# Patient Record
Sex: Female | Born: 1985
Health system: Southern US, Community
[De-identification: ages and names within clinical notes are randomized; demographics above are authoritative.]

## PROBLEM LIST (undated history)

## (undated) HISTORY — PX: ACHILLES TENDON SURGERY: SHX542

---

## 2020-08-07 ENCOUNTER — Encounter (INDEPENDENT_AMBULATORY_CARE_PROVIDER_SITE_OTHER): Payer: Self-pay | Admitting: Primary Care

## 2020-08-07 ENCOUNTER — Ambulatory Visit (INDEPENDENT_AMBULATORY_CARE_PROVIDER_SITE_OTHER): Payer: BC Managed Care – PPO | Admitting: Primary Care

## 2020-08-07 ENCOUNTER — Other Ambulatory Visit: Payer: Self-pay

## 2020-08-07 ENCOUNTER — Other Ambulatory Visit: Payer: Self-pay | Admitting: Primary Care

## 2020-08-07 VITALS — BP 117/80 | HR 71 | Temp 97.5°F | Ht 67.0 in | Wt 222.2 lb

## 2020-08-07 DIAGNOSIS — Z23 Encounter for immunization: Secondary | ICD-10-CM

## 2020-08-07 DIAGNOSIS — E6609 Other obesity due to excess calories: Secondary | ICD-10-CM

## 2020-08-07 DIAGNOSIS — Z6834 Body mass index (BMI) 34.0-34.9, adult: Secondary | ICD-10-CM

## 2020-08-07 DIAGNOSIS — J302 Other seasonal allergic rhinitis: Secondary | ICD-10-CM

## 2020-08-07 DIAGNOSIS — Z13220 Encounter for screening for lipoid disorders: Secondary | ICD-10-CM | POA: Diagnosis not present

## 2020-08-07 DIAGNOSIS — Z7689 Persons encountering health services in other specified circumstances: Secondary | ICD-10-CM

## 2020-08-07 DIAGNOSIS — Z114 Encounter for screening for human immunodeficiency virus [HIV]: Secondary | ICD-10-CM

## 2020-08-07 DIAGNOSIS — R718 Other abnormality of red blood cells: Secondary | ICD-10-CM

## 2020-08-07 DIAGNOSIS — N920 Excessive and frequent menstruation with regular cycle: Secondary | ICD-10-CM

## 2020-08-07 DIAGNOSIS — Z131 Encounter for screening for diabetes mellitus: Secondary | ICD-10-CM | POA: Diagnosis not present

## 2020-08-07 DIAGNOSIS — R5383 Other fatigue: Secondary | ICD-10-CM

## 2020-08-07 LAB — POCT GLYCOSYLATED HEMOGLOBIN (HGB A1C): Hemoglobin A1C: 5.5 % (ref 4.0–5.6)

## 2020-08-07 MED ORDER — FLUTICASONE PROPIONATE 50 MCG/ACT NA SUSP: 2.0000 | Freq: Every day | NASAL | 6 refills | Status: DC

## 2020-08-07 MED FILL — FLUTICASONE PROP 50 MCG SPR: 50 | 16 days supply | Qty: 16 | Fill #0

## 2020-08-07 NOTE — Progress Notes (Signed)
New Patient Office Visit  Subjective:  Patient ID: Janice Reyes, female    DOB: [birth year]  Age: 35 y.o. MRN: [id number]  CC:  Chief Complaint  Patient presents with  . New Patient (Initial Visit)    HPI Ms. Janice Reyes  Is a 35 year old obese female who presents for establishment of care. She voiced concerns that the plasma center told her she was not eligible to donate due to atypical antibody test.  History reviewed. No pertinent past medical history.  The histories are not reviewed yet. Please review them in the "History" navigator section and refresh this Gun[place].  History reviewed. No pertinent family history.  Social History   Socioeconomic History  . Marital status: Significant Other    Spouse name: Not on file  . Number of children: Not on file  . Years of education: Not on file  . Highest education level: Not on file  Occupational History  . Not on file  Tobacco Use  . Smoking status: Never Smoker  . Smokeless tobacco: Never Used  Substance and Sexual Activity  . Alcohol use: Not on file  . Drug use: Not on file  . Sexual activity: Not on file  Other Topics Concern  . Not on file  Social History Narrative  . Not on file   Social Determinants of Health   Financial Resource Strain: Not on file  Food Insecurity: Not on file  Transportation Needs: Not on file  Physical Activity: Not on file  Stress: Not on file  Social Connections: Not on file  Intimate Partner Violence: Not on file    ROS Review of Systems  Constitutional: Positive for fatigue.  HENT: Positive for postnasal drip and sneezing.   Eyes: Positive for itching.  Musculoskeletal: Positive for back pain.       Muscle strain back 4-5 year ago lifting weight that were too heavy   Neurological: Positive for weakness.       Tired no energy  All other systems reviewed and are negative.   Objective:   Today's Vitals: BP 117/80 (BP Location: Right Arm, Patient Position: Sitting,  Cuff Size: Large)   Pulse 71   Temp (!) 97.5 F (36.4 C) (Temporal)   Ht 5' 7"  (1.702 m)   Wt 222 lb 3.2 oz (100.8 kg)   LMP 07/27/2020 (Exact Date)   SpO2 99%   BMI 34.80 kg/m   Physical Exam Vitals reviewed.  Constitutional:      Appearance: She is obese.  HENT:     Head: Normocephalic.     Right Ear: Tympanic membrane normal. There is impacted cerumen.     Left Ear: Tympanic membrane normal. There is impacted cerumen.     Nose: Nose normal.  Eyes:     Extraocular Movements: Extraocular movements intact.     Pupils: Pupils are equal, round, and reactive to light.  Cardiovascular:     Rate and Rhythm: Normal rate and regular rhythm.  Pulmonary:     Effort: Pulmonary effort is normal.     Breath sounds: Normal breath sounds.  Abdominal:     General: Bowel sounds are normal. There is distension.     Palpations: Abdomen is soft.  Musculoskeletal:        General: Normal range of motion.     Cervical back: Normal range of motion and neck supple.  Skin:    General: Skin is warm and dry.  Neurological:     Mental Status: She is alert  and oriented to person, place, and time.  Psychiatric:        Mood and Affect: Mood normal.        Behavior: Behavior normal.        Thought Content: Thought content normal.        Judgment: Judgment normal.     Assessment & Plan:  [facility] was seen today for new patient (initial visit).  Diagnoses and all orders for this visit:  Encounter to establish care New provider previously [place]   Screening for diabetes mellitus -     HgB A1c 5.4 peer ADA guidelines not a diabetic advised weight loss and watching carbs . (family hx of DM mother and maternal grandmother )  Need for Tdap vaccination -     Tdap vaccine greater than or equal to 7yo IM  Class 1 obesity due to excess calories without serious comorbidity with body mass index (BMI) of 34.0 to 34.9 in adult Obesity is 30-39 indicating an excess in caloric intake or underlining  conditions. This may lead to other co-morbidities. Lifestyle modifications of diet and exercise may reduce obesity. Discussed risk of HTN, DM and respiratory problems  -     TSH + free T4 -     Lipid Panel  Encounter for screening for HIV ATYA test positive   Abnormal red blood cells Hx of anemia ATYA test positive  -     CBC with Differential -     CMP14+EGFR  Fatigue, unspecified type Sleeping all the time denies depression  TSH/T4  Lipid screening Obesity increase risk for CVD -     Lipid Panel    Follow-up: Return for pap is due after 03/25/21.   Kerin Perna, NP

## 2020-08-08 ENCOUNTER — Other Ambulatory Visit (INDEPENDENT_AMBULATORY_CARE_PROVIDER_SITE_OTHER): Payer: Self-pay | Admitting: Primary Care

## 2020-08-08 DIAGNOSIS — E782 Mixed hyperlipidemia: Secondary | ICD-10-CM

## 2020-08-08 LAB — TSH+FREE T4
Free T4: 1.29 ng/dL (ref 0.82–1.77)
TSH: 1.87 u[IU]/mL (ref 0.450–4.500)

## 2020-08-08 LAB — CBC WITH DIFFERENTIAL/PLATELET
Basophils Absolute: 0.1 10*3/uL (ref 0.0–0.2)
Basos: 1 %
EOS (ABSOLUTE): 0.2 10*3/uL (ref 0.0–0.4)
Eos: 4 %
Hematocrit: 35.7 % (ref 34.0–46.6)
Hemoglobin: 11.8 g/dL (ref 11.1–15.9)
Immature Grans (Abs): 0 10*3/uL (ref 0.0–0.1)
Immature Granulocytes: 0 %
Lymphocytes Absolute: 2.3 10*3/uL (ref 0.7–3.1)
Lymphs: 48 %
MCH: 28.2 pg (ref 26.6–33.0)
MCHC: 33.1 g/dL (ref 31.5–35.7)
MCV: 85 fL (ref 79–97)
Monocytes Absolute: 0.4 10*3/uL (ref 0.1–0.9)
Monocytes: 8 %
Neutrophils Absolute: 1.9 10*3/uL (ref 1.4–7.0)
Neutrophils: 39 %
Platelets: 299 10*3/uL (ref 150–450)
RBC: 4.18 x10E6/uL (ref 3.77–5.28)
RDW: 12.5 % (ref 11.7–15.4)
WBC: 4.8 10*3/uL (ref 3.4–10.8)

## 2020-08-08 LAB — CMP14+EGFR
ALT: 15 IU/L (ref 0–32)
AST: 19 IU/L (ref 0–40)
Albumin/Globulin Ratio: 1.6 (ref 1.2–2.2)
Albumin: 4.5 g/dL (ref 3.8–4.8)
Alkaline Phosphatase: 31 IU/L — ABNORMAL LOW (ref 44–121)
BUN/Creatinine Ratio: 15 (ref 9–23)
BUN: 13 mg/dL (ref 6–20)
Bilirubin Total: 0.4 mg/dL (ref 0.0–1.2)
CO2: 22 mmol/L (ref 20–29)
Calcium: 9.7 mg/dL (ref 8.7–10.2)
Chloride: 104 mmol/L (ref 96–106)
Creatinine, Ser: 0.87 mg/dL (ref 0.57–1.00)
Globulin, Total: 2.8 g/dL (ref 1.5–4.5)
Glucose: 86 mg/dL (ref 65–99)
Potassium: 4.5 mmol/L (ref 3.5–5.2)
Sodium: 139 mmol/L (ref 134–144)
Total Protein: 7.3 g/dL (ref 6.0–8.5)
eGFR: 90 mL/min/{1.73_m2} (ref >59)

## 2020-08-08 LAB — LIPID PANEL
Chol/HDL Ratio: 4.8 ratio — ABNORMAL HIGH (ref 0.0–4.4)
Cholesterol, Total: 248 mg/dL — ABNORMAL HIGH (ref 100–199)
HDL: 52 mg/dL (ref >39)
LDL Chol Calc (NIH): 174 mg/dL — ABNORMAL HIGH (ref 0–99)
Triglycerides: 120 mg/dL (ref 0–149)
VLDL Cholesterol Cal: 22 mg/dL (ref 5–40)

## 2020-08-08 MED ORDER — ATORVASTATIN CALCIUM 40 MG PO TABS: 40.0000 mg | ORAL_TABLET | Freq: Every day | ORAL | 3 refills | Status: DC

## 2021-04-17 ENCOUNTER — Encounter (HOSPITAL_COMMUNITY): Payer: Self-pay

## 2021-04-17 ENCOUNTER — Ambulatory Visit (HOSPITAL_COMMUNITY)
Admission: EM | Admit: 2021-04-17 | Discharge: 2021-04-17 | Disposition: A | Discharge status: Home / Self Care | Payer: BC Managed Care – PPO | Attending: Family Medicine | Admitting: Family Medicine

## 2021-04-17 ENCOUNTER — Ambulatory Visit (INDEPENDENT_AMBULATORY_CARE_PROVIDER_SITE_OTHER): Payer: BC Managed Care – PPO

## 2021-04-17 ENCOUNTER — Other Ambulatory Visit: Payer: Self-pay

## 2021-04-17 DIAGNOSIS — M25572 Pain in left ankle and joints of left foot: Secondary | ICD-10-CM

## 2021-04-17 DIAGNOSIS — M25571 Pain in right ankle and joints of right foot: Secondary | ICD-10-CM

## 2021-04-17 DIAGNOSIS — S86001A Unspecified injury of right Achilles tendon, initial encounter: Secondary | ICD-10-CM

## 2021-04-17 DIAGNOSIS — S99912A Unspecified injury of left ankle, initial encounter: Secondary | ICD-10-CM | POA: Diagnosis not present

## 2021-04-17 DIAGNOSIS — M79604 Pain in right leg: Secondary | ICD-10-CM | POA: Diagnosis not present

## 2021-04-17 MED ORDER — NAPROXEN 500 MG PO TABS: 500.0000 mg | ORAL_TABLET | Freq: Two times a day (BID) | ORAL | 0 refills | Status: DC

## 2021-04-17 NOTE — ED Provider Notes (Signed)
[facility]   [id number] 04/17/21 Arrival Time: 0905  ASSESSMENT & PLAN:  1. Injury of right Achilles tendon, initial encounter   2. Acute right ankle pain    Question partial Achilles tendon rupture; discussed.  I have personally viewed the imaging studies ordered this visit. No ankle fx appreciated.  Begin: Meds ordered this encounter  Medications   naproxen (NAPROSYN) 500 MG tablet    Sig: Take 1 tablet (500 mg total) by mouth 2 (two) times daily with a meal.    Dispense:  20 tablet    Refill:  0    Orders Placed This Encounter  Procedures   DG Ankle Complete Left   Apply CAM boot   Crutches  To remain non-wt bearing until specialist f/u.  Recommend:  Follow-up Information     Schedule an appointment as soon as possible for a visit  with Ortho, [facility].   Specialty: Specialist Contact information: [address] [place] [address] [phone]                 Reviewed expectations re: course of current medical issues. Questions answered. Outlined signs and symptoms indicating need for more acute intervention. Patient verbalized understanding. After Visit Summary given.  SUBJECTIVE: History from: patient. Janice Reyes is a 35 y.o. female who reports R post ankle pain; x 1 day; playing basketball; "heard a pop and felt pain". Able to bear wt since but painful. No swelling/bruising. No tx PTA. No extremity sensation changes or weakness.   OBJECTIVE:  Vitals:   04/17/21 1054  BP: (!) 143/80  Pulse: 82  Resp: 18  Temp: 98.3 F (36.8 C)  TempSrc: Oral  SpO2: 100%    General appearance: alert; no distress HEENT: [place]; AT Neck: supple with FROM Resp: unlabored respirations Extremities: RLE: warm with well perfused appearance; fairly well localized moderate tenderness over right post ankle over Achilles tendon; tendon does appear intact but soft compared to left; without gross deformities; swelling: none; bruising: none;  ankle ROM: normal CV: brisk extremity capillary refill of RLE; 2+ radial pulse of RLE. Skin: warm and dry; no visible rashes Neurologic: normal sensation and strength of RLE Psychological: alert and cooperative; normal mood and affect  Imaging: DG Ankle Complete Left  Result Date: 04/17/2021 CLINICAL DATA:  Trauma, pain EXAM: LEFT ANKLE COMPLETE - 3+ VIEW COMPARISON:  None. FINDINGS: There is no evidence of fracture, dislocation, or joint effusion. There is no evidence of arthropathy or other focal bone abnormality. Soft tissues are unremarkable. IMPRESSION: Negative. Electronically Signed   By: Ernie Avena M.D.   On: 04/17/2021 11:13      Allergies  Allergen Reactions   Amoxicillin Itching and Swelling    History reviewed. No pertinent past medical history. Social History   Socioeconomic History   Marital status: Significant Other    Spouse name: Not on file   Number of children: Not on file   Years of education: Not on file   Highest education level: Not on file  Occupational History   Not on file  Tobacco Use   Smoking status: Never   Smokeless tobacco: Never  Substance and Sexual Activity   Alcohol use: Not on file   Drug use: Not on file   Sexual activity: Not on file  Other Topics Concern   Not on file  Social History Narrative   Not on file   Social Determinants of Health   Financial Resource Strain: Not on file  Food Insecurity:  Not on file  Transportation Needs: Not on file  Physical Activity: Not on file  Stress: Not on file  Social Connections: Not on file   History reviewed. No pertinent family history. History reviewed. No pertinent surgical history.     Mardella Layman, MD 04/17/21 1248

## 2021-04-17 NOTE — ED Triage Notes (Signed)
Pt presents to the office for left ankle pain after playing basketball x 1 day.

## 2021-04-19 DIAGNOSIS — M79604 Pain in right leg: Secondary | ICD-10-CM | POA: Diagnosis not present

## 2021-05-01 DIAGNOSIS — S86011A Strain of right Achilles tendon, initial encounter: Secondary | ICD-10-CM | POA: Insufficient documentation

## 2021-05-01 DIAGNOSIS — S86091A Other specified injury of right Achilles tendon, initial encounter: Secondary | ICD-10-CM | POA: Diagnosis not present

## 2021-05-02 DIAGNOSIS — S86011A Strain of right Achilles tendon, initial encounter: Secondary | ICD-10-CM | POA: Diagnosis not present

## 2021-05-02 DIAGNOSIS — Y9367 Activity, basketball: Secondary | ICD-10-CM | POA: Diagnosis not present

## 2021-05-02 DIAGNOSIS — X58XXXA Exposure to other specified factors, initial encounter: Secondary | ICD-10-CM | POA: Diagnosis not present

## 2021-05-02 DIAGNOSIS — S86091A Other specified injury of right Achilles tendon, initial encounter: Secondary | ICD-10-CM | POA: Diagnosis not present

## 2021-05-02 DIAGNOSIS — G8918 Other acute postprocedural pain: Secondary | ICD-10-CM | POA: Diagnosis not present

## 2021-05-06 DIAGNOSIS — S86091D Other specified injury of right Achilles tendon, subsequent encounter: Secondary | ICD-10-CM | POA: Diagnosis not present

## 2021-05-22 DIAGNOSIS — S86091D Other specified injury of right Achilles tendon, subsequent encounter: Secondary | ICD-10-CM | POA: Diagnosis not present

## 2021-07-09 DIAGNOSIS — M25571 Pain in right ankle and joints of right foot: Secondary | ICD-10-CM | POA: Insufficient documentation

## 2021-07-09 DIAGNOSIS — M25671 Stiffness of right ankle, not elsewhere classified: Secondary | ICD-10-CM | POA: Diagnosis not present

## 2021-07-18 DIAGNOSIS — M25571 Pain in right ankle and joints of right foot: Secondary | ICD-10-CM | POA: Diagnosis not present

## 2021-07-18 DIAGNOSIS — M25671 Stiffness of right ankle, not elsewhere classified: Secondary | ICD-10-CM | POA: Diagnosis not present

## 2021-07-21 DIAGNOSIS — M25571 Pain in right ankle and joints of right foot: Secondary | ICD-10-CM | POA: Diagnosis not present

## 2021-07-21 DIAGNOSIS — M25671 Stiffness of right ankle, not elsewhere classified: Secondary | ICD-10-CM | POA: Diagnosis not present

## 2021-07-24 DIAGNOSIS — M25571 Pain in right ankle and joints of right foot: Secondary | ICD-10-CM | POA: Diagnosis not present

## 2021-07-24 DIAGNOSIS — M25671 Stiffness of right ankle, not elsewhere classified: Secondary | ICD-10-CM | POA: Diagnosis not present

## 2021-07-28 DIAGNOSIS — M25671 Stiffness of right ankle, not elsewhere classified: Secondary | ICD-10-CM | POA: Diagnosis not present

## 2021-07-28 DIAGNOSIS — M25571 Pain in right ankle and joints of right foot: Secondary | ICD-10-CM | POA: Diagnosis not present

## 2021-08-08 DIAGNOSIS — M25671 Stiffness of right ankle, not elsewhere classified: Secondary | ICD-10-CM | POA: Diagnosis not present

## 2021-08-08 DIAGNOSIS — M25571 Pain in right ankle and joints of right foot: Secondary | ICD-10-CM | POA: Diagnosis not present

## 2021-08-12 DIAGNOSIS — M25671 Stiffness of right ankle, not elsewhere classified: Secondary | ICD-10-CM | POA: Diagnosis not present

## 2021-08-12 DIAGNOSIS — M25571 Pain in right ankle and joints of right foot: Secondary | ICD-10-CM | POA: Diagnosis not present

## 2021-08-16 ENCOUNTER — Other Ambulatory Visit (INDEPENDENT_AMBULATORY_CARE_PROVIDER_SITE_OTHER): Payer: Self-pay | Admitting: Primary Care

## 2021-08-16 DIAGNOSIS — E782 Mixed hyperlipidemia: Secondary | ICD-10-CM

## 2021-08-18 DIAGNOSIS — M25671 Stiffness of right ankle, not elsewhere classified: Secondary | ICD-10-CM | POA: Diagnosis not present

## 2021-08-18 DIAGNOSIS — M25571 Pain in right ankle and joints of right foot: Secondary | ICD-10-CM | POA: Diagnosis not present

## 2021-08-21 DIAGNOSIS — M25571 Pain in right ankle and joints of right foot: Secondary | ICD-10-CM | POA: Diagnosis not present

## 2021-08-21 DIAGNOSIS — M25671 Stiffness of right ankle, not elsewhere classified: Secondary | ICD-10-CM | POA: Diagnosis not present

## 2021-08-22 DIAGNOSIS — S86091A Other specified injury of right Achilles tendon, initial encounter: Secondary | ICD-10-CM | POA: Diagnosis not present

## 2021-08-25 DIAGNOSIS — M25671 Stiffness of right ankle, not elsewhere classified: Secondary | ICD-10-CM | POA: Diagnosis not present

## 2021-08-25 DIAGNOSIS — M25571 Pain in right ankle and joints of right foot: Secondary | ICD-10-CM | POA: Diagnosis not present

## 2021-09-01 DIAGNOSIS — M25671 Stiffness of right ankle, not elsewhere classified: Secondary | ICD-10-CM | POA: Diagnosis not present

## 2021-09-01 DIAGNOSIS — M25571 Pain in right ankle and joints of right foot: Secondary | ICD-10-CM | POA: Diagnosis not present

## 2021-09-10 DIAGNOSIS — M25571 Pain in right ankle and joints of right foot: Secondary | ICD-10-CM | POA: Diagnosis not present

## 2021-09-10 DIAGNOSIS — M25671 Stiffness of right ankle, not elsewhere classified: Secondary | ICD-10-CM | POA: Diagnosis not present

## 2021-09-17 DIAGNOSIS — M25671 Stiffness of right ankle, not elsewhere classified: Secondary | ICD-10-CM | POA: Diagnosis not present

## 2021-09-17 DIAGNOSIS — M25571 Pain in right ankle and joints of right foot: Secondary | ICD-10-CM | POA: Diagnosis not present

## 2021-09-22 DIAGNOSIS — M25571 Pain in right ankle and joints of right foot: Secondary | ICD-10-CM | POA: Diagnosis not present

## 2021-09-22 DIAGNOSIS — M25671 Stiffness of right ankle, not elsewhere classified: Secondary | ICD-10-CM | POA: Diagnosis not present

## 2021-10-17 DIAGNOSIS — M25671 Stiffness of right ankle, not elsewhere classified: Secondary | ICD-10-CM | POA: Diagnosis not present

## 2021-10-17 DIAGNOSIS — M25571 Pain in right ankle and joints of right foot: Secondary | ICD-10-CM | POA: Diagnosis not present

## 2021-11-25 ENCOUNTER — Ambulatory Visit (HOSPITAL_COMMUNITY)
Admission: EM | Admit: 2021-11-25 | Discharge: 2021-11-25 | Disposition: A | Discharge status: Home / Self Care | Payer: BC Managed Care – PPO | Attending: Family Medicine | Admitting: Family Medicine

## 2021-11-25 ENCOUNTER — Encounter (HOSPITAL_COMMUNITY): Payer: Self-pay | Admitting: Emergency Medicine

## 2021-11-25 DIAGNOSIS — U071 COVID-19: Secondary | ICD-10-CM | POA: Insufficient documentation

## 2021-11-25 DIAGNOSIS — J029 Acute pharyngitis, unspecified: Secondary | ICD-10-CM | POA: Diagnosis not present

## 2021-11-25 DIAGNOSIS — R051 Acute cough: Secondary | ICD-10-CM | POA: Diagnosis not present

## 2021-11-25 DIAGNOSIS — J069 Acute upper respiratory infection, unspecified: Secondary | ICD-10-CM

## 2021-11-25 LAB — POCT RAPID STREP A, ED / UC: Streptococcus, Group A Screen (Direct): NEGATIVE

## 2021-11-25 LAB — SARS CORONAVIRUS 2 (TAT 6-24 HRS): SARS Coronavirus 2: POSITIVE — AB

## 2021-11-25 MED ORDER — BENZONATATE 100 MG PO CAPS: 100.0000 mg | ORAL_CAPSULE | Freq: Three times a day (TID) | ORAL | 0 refills | Status: DC | PRN

## 2021-11-25 NOTE — ED Provider Notes (Signed)
[facility]    CSN: [id number] Arrival date & time: 11/25/21  8341      History   Chief Complaint Chief Complaint  Patient presents with   Cough   Nasal Congestion    HPI Janice Reyes is a 36 y.o. female.    Cough  Here for headache, congestion, and some sore throat that began yesterday.  She felt feverish yesterday.  This morning she began coughing.  The throat still is bothering her.  No nausea, vomiting, or diarrhea.  No shortness of breath.  Today she does feel very tired.  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  History reviewed. No pertinent past medical history.  There are no problems to display for this patient.   Past Surgical History:  Procedure Laterality Date   ACHILLES TENDON SURGERY Right     OB History   No obstetric history on file.      Home Medications    Prior to Admission medications   Medication Sig Start Date End Date Taking? Authorizing Provider  benzonatate (TESSALON) 100 MG capsule Take 1 capsule (100 mg total) by mouth 3 (three) times daily as needed for cough. 11/25/21  Yes Zenia Resides, MD    Family History No family history on file.  Social History Social History   Tobacco Use   Smoking status: Never   Smokeless tobacco: Never     Allergies   Amoxicillin   Review of Systems Review of Systems  Respiratory:  Positive for cough.      Physical Exam Triage Vital Signs ED Triage Vitals  Enc Vitals Group     BP 11/25/21 0831 112/76     Pulse Rate 11/25/21 0831 74     Resp 11/25/21 0831 16     Temp 11/25/21 0831 98.9 F (37.2 C)     Temp src --      SpO2 11/25/21 0831 97 %     Weight --      Height --      Head Circumference --      Peak Flow --      Pain Score 11/25/21 0829 0     Pain Loc --      Pain Edu? --      Excl. in GC? --    No data found.  Updated Vital Signs BP 112/76 (BP Location: Left Arm)    Pulse 74   Temp 98.9 F (37.2 C)   Resp 16   LMP 10/30/2021   SpO2 97%   Visual Acuity Right Eye Distance:   Left Eye Distance:   Bilateral Distance:    Right Eye Near:   Left Eye Near:    Bilateral Near:     Physical Exam Vitals reviewed.  Constitutional:      General: She is not in acute distress.    Appearance: She is not toxic-appearing.  HENT:     Right Ear: Tympanic membrane and ear canal normal.     Left Ear: Tympanic membrane and ear canal normal.     Nose: Nose normal.     Mouth/Throat:     Mouth: Mucous membranes are moist.     Comments: There is mild erythema of the posterior oropharynx and some clear mucus draining. Eyes:     Extraocular Movements: Extraocular movements intact.     Conjunctiva/sclera: Conjunctivae normal.     Pupils: Pupils are equal, round, and reactive  to light.  Cardiovascular:     Rate and Rhythm: Normal rate and regular rhythm.     Heart sounds: No murmur heard. Pulmonary:     Effort: Pulmonary effort is normal. No respiratory distress.     Breath sounds: No stridor. No wheezing, rhonchi or rales.  Musculoskeletal:     Cervical back: Neck supple.  Lymphadenopathy:     Cervical: No cervical adenopathy.  Skin:    Capillary Refill: Capillary refill takes less than 2 seconds.     Coloration: Skin is not jaundiced or pale.  Neurological:     General: No focal deficit present.     Mental Status: She is alert and oriented to person, place, and time.  Psychiatric:        Behavior: Behavior normal.      UC Treatments / Results  Labs (all labs ordered are listed, but only abnormal results are displayed) Labs Reviewed  CULTURE, GROUP A STREP ([facility])  SARS CORONAVIRUS 2 (TAT 6-24 HRS)  POCT RAPID STREP A, ED / UC    EKG   Radiology No results found.  Procedures Procedures (including critical care time)  Medications Ordered in UC Medications - No data to display  Initial Impression / Assessment and Plan / UC Course  I  have reviewed the triage vital signs and the nursing notes.  Pertinent labs & imaging results that were available during my care of the patient were reviewed by me and considered in my medical decision making (see chart for details).     Rapid strep is negative, and we will culture and treat per protocol if positive  She is swabbed for COVID, and would benefit from Paxlovid prescription if positive.  Her creatinine was normal when last done in epic.  Symptomatic treatment Final Clinical Impressions(s) / UC Diagnoses   Final diagnoses:  Viral URI with cough  Sore throat     Discharge Instructions      Your strep test is negative.  Culture of the throat will be sent, and staff will notify you if that is in turn positive.   You have been swabbed for COVID, and the test will result in the next 24 hours. Our staff will call you if positive. If the test is positive, you should quarantine for 5 days.  Take benzonatate 100 mg, 1 tab every 8 hours as needed for cough.  You can also take Mucinex D or DayQuil/NyQuil as needed for the congestion symptoms.     ED Prescriptions     Medication Sig Dispense Auth. Provider   benzonatate (TESSALON) 100 MG capsule Take 1 capsule (100 mg total) by mouth 3 (three) times daily as needed for cough. 21 capsule Zenia Resides, MD      PDMP not reviewed this encounter.   Zenia Resides, MD 11/25/21 [phone]

## 2021-11-25 NOTE — ED Triage Notes (Signed)
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 bElieves was feverish yesterday.

## 2021-11-25 NOTE — Discharge Instructions (Addendum)
Your strep test is negative.  Culture of the throat will be sent, and staff will notify you if that is in turn positive.   You have been swabbed for COVID, and the test will result in the next 24 hours. Our staff will call you if positive. If the test is positive, you should quarantine for 5 days.  Take benzonatate 100 mg, 1 tab every 8 hours as needed for cough.  You can also take Mucinex D or DayQuil/NyQuil as needed for the congestion symptoms.

## 2021-11-26 ENCOUNTER — Telehealth (HOSPITAL_COMMUNITY): Payer: Self-pay | Admitting: Emergency Medicine

## 2021-11-26 MED ORDER — NIRMATRELVIR/RITONAVIR (PAXLOVID)TABLET: 3.0000 | ORAL_TABLET | Freq: Two times a day (BID) | ORAL | 0 refills | Status: AC

## 2021-11-26 MED ORDER — NIRMATRELVIR/RITONAVIR (PAXLOVID)TABLET: 3.0000 | ORAL_TABLET | Freq: Two times a day (BID) | ORAL | 0 refills | Status: DC

## 2021-11-27 LAB — CULTURE, GROUP A STREP (THRC)

## 2021-12-01 ENCOUNTER — Ambulatory Visit: Payer: BC Managed Care – PPO | Admitting: Family Medicine

## 2021-12-22 ENCOUNTER — Ambulatory Visit: Payer: BC Managed Care – PPO | Admitting: Family Medicine

## 2022-02-23 ENCOUNTER — Ambulatory Visit: Payer: 59 | Admitting: Family Medicine

## 2022-02-23 ENCOUNTER — Encounter: Payer: Self-pay | Admitting: Family Medicine

## 2022-02-23 VITALS — BP 128/80 | HR 75 | Temp 98.7°F | Resp 16 | Ht 67.0 in | Wt 211.2 lb

## 2022-02-23 DIAGNOSIS — Z130 Encounter for screening for diseases of the blood and blood-forming organs and certain disorders involving the immune mechanism: Secondary | ICD-10-CM

## 2022-02-23 DIAGNOSIS — Z0000 Encounter for general adult medical examination without abnormal findings: Secondary | ICD-10-CM | POA: Diagnosis not present

## 2022-02-23 DIAGNOSIS — Z13220 Encounter for screening for lipoid disorders: Secondary | ICD-10-CM

## 2022-02-23 DIAGNOSIS — Z13228 Encounter for screening for other metabolic disorders: Secondary | ICD-10-CM

## 2022-02-23 DIAGNOSIS — Z1159 Encounter for screening for other viral diseases: Secondary | ICD-10-CM

## 2022-02-23 DIAGNOSIS — Z1329 Encounter for screening for other suspected endocrine disorder: Secondary | ICD-10-CM

## 2022-02-24 LAB — CMP14+EGFR
ALT: 13 IU/L (ref 0–32)
AST: 18 IU/L (ref 0–40)
Albumin/Globulin Ratio: 1.6 (ref 1.2–2.2)
Albumin: 4.5 g/dL (ref 3.9–4.9)
Alkaline Phosphatase: 37 IU/L — ABNORMAL LOW (ref 44–121)
BUN/Creatinine Ratio: 10 (ref 9–23)
BUN: 8 mg/dL (ref 6–20)
Bilirubin Total: 0.3 mg/dL (ref 0.0–1.2)
CO2: 21 mmol/L (ref 20–29)
Calcium: 10.1 mg/dL (ref 8.7–10.2)
Chloride: 102 mmol/L (ref 96–106)
Creatinine, Ser: 0.79 mg/dL (ref 0.57–1.00)
Globulin, Total: 2.9 g/dL (ref 1.5–4.5)
Glucose: 81 mg/dL (ref 70–99)
Potassium: 4.2 mmol/L (ref 3.5–5.2)
Sodium: 141 mmol/L (ref 134–144)
Total Protein: 7.4 g/dL (ref 6.0–8.5)
eGFR: 101 mL/min/{1.73_m2} (ref >59)

## 2022-02-24 LAB — LIPID PANEL
Chol/HDL Ratio: 4.3 ratio (ref 0.0–4.4)
Cholesterol, Total: 219 mg/dL — ABNORMAL HIGH (ref 100–199)
HDL: 51 mg/dL (ref >39)
LDL Chol Calc (NIH): 147 mg/dL — ABNORMAL HIGH (ref 0–99)
Triglycerides: 119 mg/dL (ref 0–149)
VLDL Cholesterol Cal: 21 mg/dL (ref 5–40)

## 2022-02-24 LAB — CBC WITH DIFFERENTIAL/PLATELET
Basophils Absolute: 0.1 10*3/uL (ref 0.0–0.2)
Basos: 1 %
EOS (ABSOLUTE): 0.1 10*3/uL (ref 0.0–0.4)
Eos: 3 %
Hematocrit: 35.5 % (ref 34.0–46.6)
Hemoglobin: 11.5 g/dL (ref 11.1–15.9)
Immature Grans (Abs): 0 10*3/uL (ref 0.0–0.1)
Immature Granulocytes: 0 %
Lymphocytes Absolute: 2.4 10*3/uL (ref 0.7–3.1)
Lymphs: 45 %
MCH: 28 pg (ref 26.6–33.0)
MCHC: 32.4 g/dL (ref 31.5–35.7)
MCV: 86 fL (ref 79–97)
Monocytes Absolute: 0.4 10*3/uL (ref 0.1–0.9)
Monocytes: 7 %
Neutrophils Absolute: 2.3 10*3/uL (ref 1.4–7.0)
Neutrophils: 44 %
Platelets: 320 10*3/uL (ref 150–450)
RBC: 4.11 x10E6/uL (ref 3.77–5.28)
RDW: 12.5 % (ref 11.7–15.4)
WBC: 5.2 10*3/uL (ref 3.4–10.8)

## 2022-02-24 LAB — HEMOGLOBIN A1C
Est. average glucose Bld gHb Est-mCnc: 114 mg/dL
Hgb A1c MFr Bld: 5.6 % (ref 4.8–5.6)

## 2022-02-24 LAB — TSH: TSH: 2.3 u[IU]/mL (ref 0.450–4.500)

## 2022-02-24 LAB — HEPATITIS C ANTIBODY: Hep C Virus Ab: NONREACTIVE

## 2022-02-25 ENCOUNTER — Encounter: Payer: Self-pay | Admitting: Family Medicine

## 2022-02-25 NOTE — Progress Notes (Signed)
Established Patient Office Visit  Subjective    Patient ID: Janice Reyes, female    DOB: [birth year]  Age: 36 y.o. MRN: [id number]  CC: No chief complaint on file.   HPI Clary Boulais presents to establish care with a new provider as well as a routine annual exam. Patient denies acute complaints or concerns.    Outpatient Encounter Medications as of 02/23/2022  Medication Sig   atorvastatin (LIPITOR) 40 MG tablet Take 1 tablet by mouth daily.   [DISCONTINUED] benzonatate (TESSALON) 100 MG capsule Take 1 capsule (100 mg total) by mouth 3 (three) times daily as needed for cough.   No facility-administered encounter medications on file as of 02/23/2022.    No past medical history on file.  Past Surgical History:  Procedure Laterality Date   ACHILLES TENDON SURGERY Right     Family History  Problem Relation Age of Onset   Diabetes Mother    Hypertension Mother    Obesity Mother    Arthritis Father    Hearing loss Father    Arthritis Maternal Grandmother    Diabetes Maternal Grandmother    Alcohol abuse Sister    Heart disease Paternal Uncle    Stroke Paternal Uncle     Social History   Socioeconomic History   Marital status: Significant Other    Spouse name: Not on file   Number of children: Not on file   Years of education: Not on file   Highest education level: Not on file  Occupational History   Not on file  Tobacco Use   Smoking status: Never   Smokeless tobacco: Never  Substance and Sexual Activity   Alcohol use: Not Currently   Drug use: Yes    Types: Marijuana   Sexual activity: Yes    Birth control/protection: None  Other Topics Concern   Not on file  Social History Narrative   Not on file   Social Determinants of Health   Financial Resource Strain: Not on file  Food Insecurity: Not on file  Transportation Needs: Not on file  Physical Activity: Not on file  Stress: Not on file  Social Connections: Not on file   Intimate Partner Violence: Not on file    Review of Systems  All other systems reviewed and are negative.       Objective    BP 128/80   Pulse 75   Temp 98.7 F (37.1 C) (Oral)   Resp 16   Ht _0  (1.702 m)   Wt 211 lb 3.2 oz (95.8 kg)   SpO2 98%   BMI 33.08 kg/m   Physical Exam Vitals and nursing note reviewed.  Constitutional:      General: She is not in acute distress. HENT:     Head: Normocephalic and atraumatic.     Right Ear: Tympanic membrane, ear canal and external ear normal.     Left Ear: Tympanic membrane, ear canal and external ear normal.     Nose: Nose normal.     Mouth/Throat:     Mouth: Mucous membranes are moist.     Pharynx: Oropharynx is clear.  Eyes:     Conjunctiva/sclera: Conjunctivae normal.     Pupils: Pupils are equal, round, and reactive to light.  Neck:     Thyroid: No thyromegaly.  Cardiovascular:     Rate and Rhythm: Normal rate and regular rhythm.     Heart sounds: Normal heart sounds. No murmur heard. Pulmonary:  Effort: Pulmonary effort is normal. No respiratory distress.     Breath sounds: Normal breath sounds.  Abdominal:     General: There is no distension.     Palpations: Abdomen is soft. There is no mass.     Tenderness: There is no abdominal tenderness.  Musculoskeletal:        General: Normal range of motion.     Cervical back: Normal range of motion and neck supple.  Skin:    General: Skin is warm and dry.  Neurological:     General: No focal deficit present.     Mental Status: She is alert and oriented to person, place, and time.  Psychiatric:        Mood and Affect: Mood normal.        Behavior: Behavior normal.         Assessment & Plan:   1. Annual physical exam  - CMP14+EGFR  2. Screening for lipid disorders  - Lipid Panel  3. Screening for deficiency anemia  - CBC with Differential  4. Screening for endocrine/metabolic/immunity disorders  - Hemoglobin A1c - TSH  5. Need for  hepatitis C screening test  - Hepatitis C Antibody    No follow-ups on file.   Becky Sax, MD

## 2023-01-14 ENCOUNTER — Ambulatory Visit: Payer: Self-pay

## 2023-01-15 ENCOUNTER — Ambulatory Visit: Payer: Self-pay

## 2023-07-25 IMAGING — DX DG ANKLE COMPLETE 3+V*L*
3 series · 3 of 3 positions shown · non-contrast
Comparison: None.

CLINICAL DATA: Trauma, pain

EXAM:
LEFT ANKLE COMPLETE - 3+ VIEW

[ankle ap]
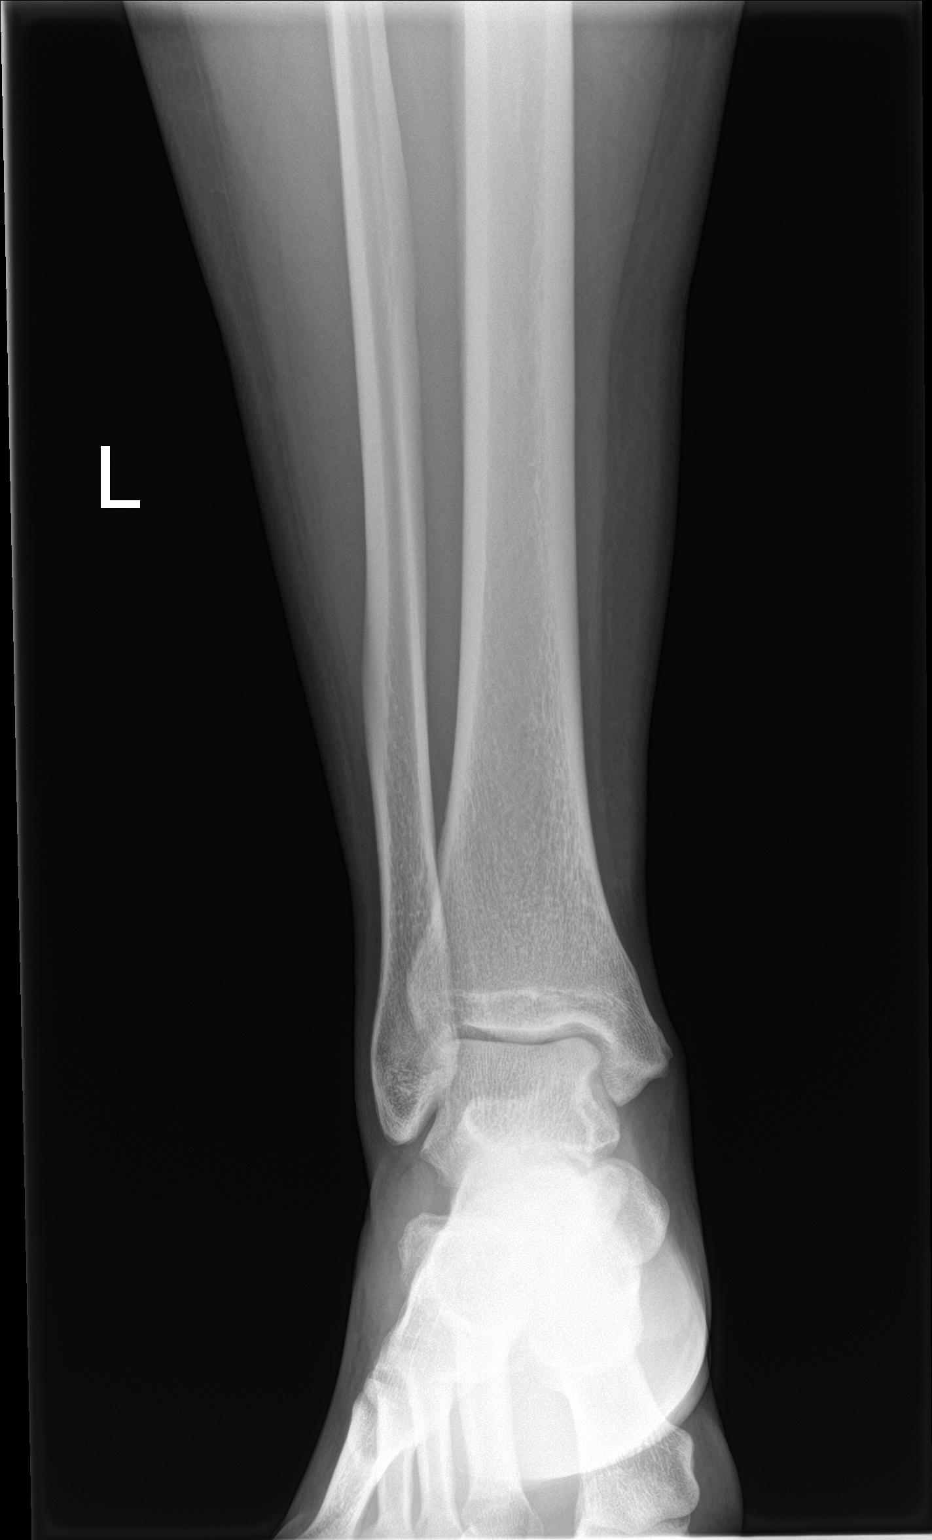

[ankle obl]
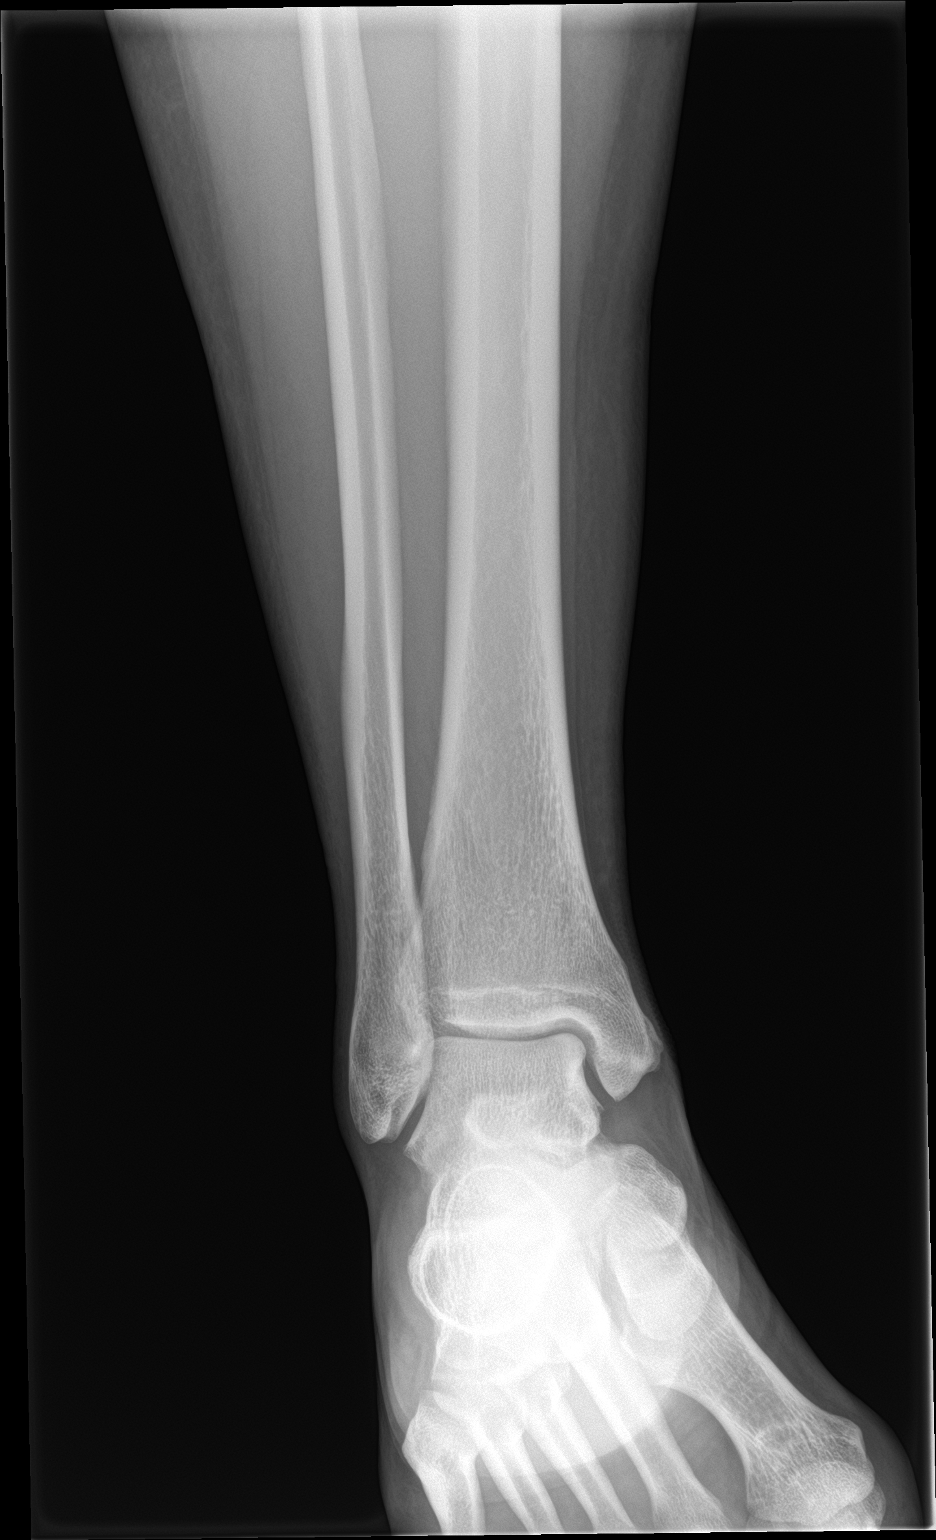

[ankle lat]
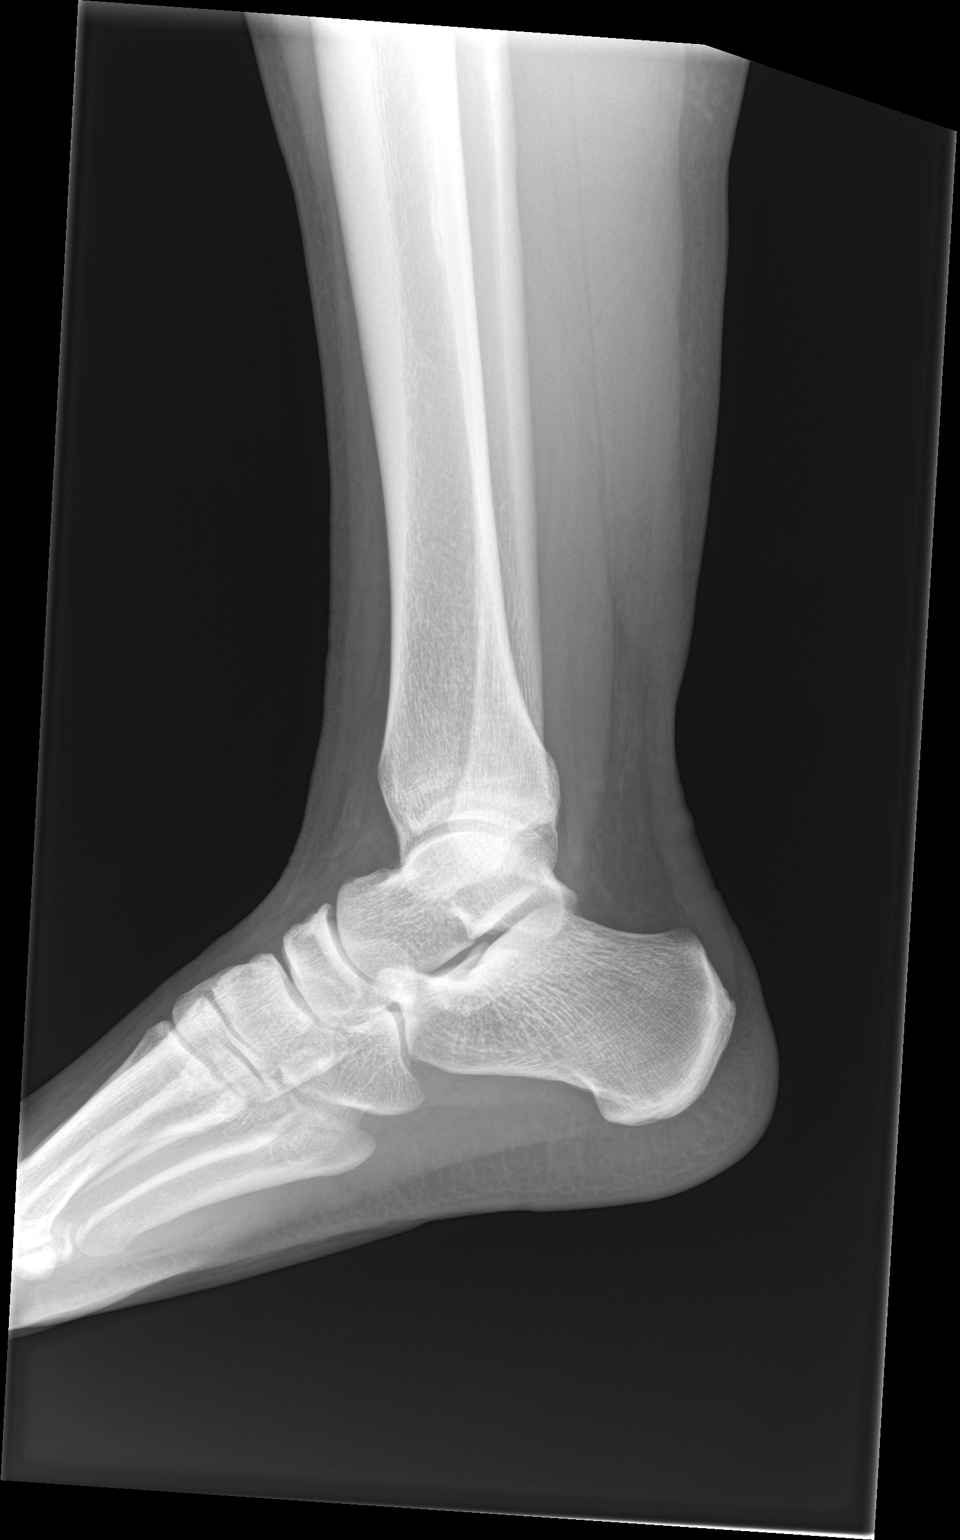

[3 of 3 positions shown; findings below may reference images not displayed]

FINDINGS: There is no evidence of fracture, dislocation, or joint effusion.
There is no evidence of arthropathy or other focal bone abnormality.
Soft tissues are unremarkable.
IMPRESSION: Negative.

## 2023-11-15 ENCOUNTER — Encounter: Payer: Self-pay | Admitting: Family Medicine

## 2023-11-15 ENCOUNTER — Ambulatory Visit (INDEPENDENT_AMBULATORY_CARE_PROVIDER_SITE_OTHER): Admitting: Family Medicine

## 2023-11-15 VITALS — BP 123/85 | HR 63 | Ht 67.0 in | Wt 213.2 lb

## 2023-11-15 DIAGNOSIS — Z114 Encounter for screening for human immunodeficiency virus [HIV]: Secondary | ICD-10-CM

## 2023-11-15 DIAGNOSIS — Z1329 Encounter for screening for other suspected endocrine disorder: Secondary | ICD-10-CM | POA: Diagnosis not present

## 2023-11-15 DIAGNOSIS — Z0000 Encounter for general adult medical examination without abnormal findings: Secondary | ICD-10-CM | POA: Diagnosis not present

## 2023-11-15 DIAGNOSIS — Z130 Encounter for screening for diseases of the blood and blood-forming organs and certain disorders involving the immune mechanism: Secondary | ICD-10-CM | POA: Diagnosis not present

## 2023-11-15 DIAGNOSIS — Z13228 Encounter for screening for other metabolic disorders: Secondary | ICD-10-CM

## 2023-11-15 DIAGNOSIS — Z6833 Body mass index (BMI) 33.0-33.9, adult: Secondary | ICD-10-CM

## 2023-11-15 DIAGNOSIS — E66811 Obesity, class 1: Secondary | ICD-10-CM

## 2023-11-15 DIAGNOSIS — Z13220 Encounter for screening for lipoid disorders: Secondary | ICD-10-CM

## 2023-11-15 DIAGNOSIS — E6609 Other obesity due to excess calories: Secondary | ICD-10-CM

## 2023-11-15 NOTE — Addendum Note (Signed)
 Addended by: TANDA RAGUEL SQUIBB on: 11/15/2023 03:31 PM   Modules accepted: Orders

## 2023-11-15 NOTE — Progress Notes (Addendum)
 Established Patient Office Visit  Subjective    Patient ID: Janice Reyes, female    DOB: 04-09-1986  Age: 38 y.o. MRN: [id number]  CC:  Chief Complaint  Patient presents with   Annual Exam   Weight Management Screening    HPI Janice Reyes presents for routine annual exam. Patient denies chronic med issues or acute complaints.   Outpatient Encounter Medications as of 11/15/2023  Medication Sig   [DISCONTINUED] atorvastatin  (LIPITOR) 40 MG tablet Take 1 tablet by mouth daily.   No facility-administered encounter medications on file as of 11/15/2023.    History reviewed. No pertinent past medical history.  Past Surgical History:  Procedure Laterality Date   ACHILLES TENDON SURGERY Right     Family History  Problem Relation Age of Onset   Diabetes Mother    Hypertension Mother    Obesity Mother    Arthritis Father    Hearing loss Father    Arthritis Maternal Grandmother    Diabetes Maternal Grandmother    Alcohol abuse Sister    Heart disease Paternal Uncle    Stroke Paternal Uncle     Social History   Socioeconomic History   Marital status: Single    Spouse name: Not on file   Number of children: Not on file   Years of education: Not on file   Highest education level: Master's degree (e.g., MA, MS, MEng, MEd, MSW, MBA)  Occupational History   Not on file  Tobacco Use   Smoking status: Never   Smokeless tobacco: Never  Substance and Sexual Activity   Alcohol use: Not Currently   Drug use: Yes    Types: Marijuana   Sexual activity: Yes    Birth control/protection: None  Other Topics Concern   Not on file  Social History Narrative   Not on file   Social Drivers of Health   Financial Resource Strain: Low Risk  (11/13/2023)   Overall Financial Resource Strain (CARDIA)    Difficulty of Paying Living Expenses: Not hard at all  Food Insecurity: No Food Insecurity (11/13/2023)   Hunger Vital Sign    Worried About Running Out of Food in  the Last Year: Never true    Ran Out of Food in the Last Year: Never true  Transportation Needs: No Transportation Needs (11/13/2023)   PRAPARE - Administrator, Civil Service (Medical): No    Lack of Transportation (Non-Medical): No  Physical Activity: Insufficiently Active (11/13/2023)   Exercise Vital Sign    Days of Exercise per Week: 3 days    Minutes of Exercise per Session: 30 min  Stress: No Stress Concern Present (11/13/2023)   [employer] of Occupational Health - Occupational Stress Questionnaire    Feeling of Stress: Not at all  Social Connections: Moderately Integrated (11/13/2023)   Social Connection and Isolation Panel    Frequency of Communication with Friends and Family: More than three times a week    Frequency of Social Gatherings with Friends and Family: Twice a week    Attends Religious Services: More than 4 times per year    Active Member of [employer] or Organizations: Yes    Attends Banker Meetings: More than 4 times per year    Marital Status: Never married  Intimate Partner Violence: Not At Risk (11/15/2023)   Humiliation, Afraid, Rape, and Kick questionnaire    Fear of Current or Ex-Partner: No    Emotionally Abused: No    Physically Abused:  No    Sexually Abused: No    Review of Systems  All other systems reviewed and are negative.       Objective    BP 123/85 (BP Location: Left Arm, Patient Position: Sitting, Cuff Size: Normal)   Pulse 63   Ht 5' 7 (1.702 m)   Wt 213 lb 3.2 oz (96.7 kg)   LMP 11/08/2023   SpO2 98%   BMI 33.39 kg/m   Physical Exam Vitals and nursing note reviewed.  Constitutional:      General: She is not in acute distress.    Appearance: She is obese.  HENT:     Head: Normocephalic and atraumatic.     Right Ear: Tympanic membrane, ear canal and external ear normal.     Left Ear: Tympanic membrane, ear canal and external ear normal.     Nose: Nose normal.     Mouth/Throat:     Mouth: Mucous  membranes are moist.     Pharynx: Oropharynx is clear.  Eyes:     Conjunctiva/sclera: Conjunctivae normal.     Pupils: Pupils are equal, round, and reactive to light.  Neck:     Thyroid: No thyromegaly.  Cardiovascular:     Rate and Rhythm: Normal rate and regular rhythm.     Heart sounds: Normal heart sounds. No murmur heard. Pulmonary:     Effort: Pulmonary effort is normal. No respiratory distress.     Breath sounds: Normal breath sounds.  Abdominal:     General: There is no distension.     Palpations: Abdomen is soft. There is no mass.     Tenderness: There is no abdominal tenderness.  Musculoskeletal:        General: Normal range of motion.     Cervical back: Normal range of motion and neck supple.  Skin:    General: Skin is warm and dry.  Neurological:     General: No focal deficit present.     Mental Status: She is alert and oriented to person, place, and time.  Psychiatric:        Mood and Affect: Mood normal.        Behavior: Behavior normal.         Assessment & Plan:   Annual physical exam -     Comprehensive metabolic panel with GFR  Screening for deficiency anemia -     CBC with Differential/Platelet  Screening for lipid disorders -     Lipid panel  Screening for endocrine/metabolic/immunity disorders -     TSH -     Hemoglobin A1c -     VITAMIN D  25 Hydroxy (Vit-D Deficiency, Fractures)  Screening for HIV (human immunodeficiency virus) -     HIV Antibody (routine testing w rflx)  Class 1 obesity due to excess calories without serious comorbidity with body mass index (BMI) of 33.0 to 33.9 in adult -     Amb ref to Medical Nutrition Therapy-MNT     Return if symptoms worsen or fail to improve.   Tanda Raguel SQUIBB, MD

## 2023-11-16 ENCOUNTER — Encounter: Admitting: Family Medicine

## 2023-11-17 ENCOUNTER — Encounter: Payer: Self-pay | Admitting: Dietician

## 2023-11-17 ENCOUNTER — Encounter: Attending: Family Medicine | Admitting: Dietician

## 2023-11-17 DIAGNOSIS — E66811 Obesity, class 1: Secondary | ICD-10-CM | POA: Diagnosis present

## 2023-11-17 LAB — COMPREHENSIVE METABOLIC PANEL WITH GFR
ALT: 14 IU/L (ref 0–32)
AST: 24 IU/L (ref 0–40)
Albumin: 4.4 g/dL (ref 3.9–4.9)
Alkaline Phosphatase: 33 IU/L — ABNORMAL LOW (ref 44–121)
BUN/Creatinine Ratio: 9 (ref 9–23)
BUN: 7 mg/dL (ref 6–20)
Bilirubin Total: 0.3 mg/dL (ref 0.0–1.2)
CO2: 18 mmol/L — ABNORMAL LOW (ref 20–29)
Calcium: 9.8 mg/dL (ref 8.7–10.2)
Chloride: 105 mmol/L (ref 96–106)
Creatinine, Ser: 0.8 mg/dL (ref 0.57–1.00)
Globulin, Total: 2.7 g/dL (ref 1.5–4.5)
Glucose: 80 mg/dL (ref 70–99)
Potassium: 4.7 mmol/L (ref 3.5–5.2)
Sodium: 140 mmol/L (ref 134–144)
Total Protein: 7.1 g/dL (ref 6.0–8.5)
eGFR: 98 mL/min/1.73 (ref >59)

## 2023-11-17 LAB — HEMOGLOBIN A1C
Est. average glucose Bld gHb Est-mCnc: 108 mg/dL
Hgb A1c MFr Bld: 5.4 % (ref 4.8–5.6)

## 2023-11-17 LAB — HIV ANTIBODY (ROUTINE TESTING W REFLEX): HIV Screen 4th Generation wRfx: NONREACTIVE

## 2023-11-17 LAB — LIPID PANEL
Chol/HDL Ratio: 4.3 ratio (ref 0.0–4.4)
Cholesterol, Total: 237 mg/dL — ABNORMAL HIGH (ref 100–199)
HDL: 55 mg/dL (ref >39)
LDL Chol Calc (NIH): 156 mg/dL — ABNORMAL HIGH (ref 0–99)
Triglycerides: 146 mg/dL (ref 0–149)
VLDL Cholesterol Cal: 26 mg/dL (ref 5–40)

## 2023-11-17 LAB — CBC WITH DIFFERENTIAL/PLATELET
Basophils Absolute: 0.1 x10E3/uL (ref 0.0–0.2)
Basos: 1 %
EOS (ABSOLUTE): 0.2 x10E3/uL (ref 0.0–0.4)
Eos: 5 %
Hematocrit: 40.7 % (ref 34.0–46.6)
Hemoglobin: 12.8 g/dL (ref 11.1–15.9)
Immature Grans (Abs): 0 x10E3/uL (ref 0.0–0.1)
Immature Granulocytes: 0 %
Lymphocytes Absolute: 2.4 x10E3/uL (ref 0.7–3.1)
Lymphs: 49 %
MCH: 28.5 pg (ref 26.6–33.0)
MCHC: 31.4 g/dL — ABNORMAL LOW (ref 31.5–35.7)
MCV: 91 fL (ref 79–97)
Monocytes Absolute: 0.4 x10E3/uL (ref 0.1–0.9)
Monocytes: 8 %
Neutrophils Absolute: 1.8 x10E3/uL (ref 1.4–7.0)
Neutrophils: 37 %
Platelets: 338 x10E3/uL (ref 150–450)
RBC: 4.49 x10E6/uL (ref 3.77–5.28)
RDW: 12.6 % (ref 11.7–15.4)
WBC: 4.8 x10E3/uL (ref 3.4–10.8)

## 2023-11-17 LAB — VITAMIN D 25 HYDROXY (VIT D DEFICIENCY, FRACTURES): Vit D, 25-Hydroxy: 23.1 ng/mL — ABNORMAL LOW (ref 30.0–100.0)

## 2023-11-17 LAB — TSH: TSH: 1.72 u[IU]/mL (ref 0.450–4.500)

## 2023-11-17 NOTE — Progress Notes (Signed)
 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   Clinical Medical Hx: reviewed Medications: reviewed Labs: 11/15/23: chol 237, LDL 156, vitamin D  23.1  Notable Signs/Symptoms: none reported Food Allergies: none Foods Avoided: pork  Lifestyle & Dietary Hx  Pt reports she wants to work on her eating habits and making healthier habits. Pt states she doesn't know how to cook many things and doesn't enjoy cooking much. Pt reports she can make chicken, vegetables, and simple things.   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 Pt states during the school year she may pack lunch or eat out. Pt states she may skip breakfast some days. 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   Pt states she has family history of high cholesterol.   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  Estimated daily fluid intake: 64-96 oz Supplements: vitamin c, iron Sleep: 8 hours  Stress / self-care: low stress Current average weekly physical activity: basketball 1.5 hours 3x/wk.   24-Hr Dietary Recall First Meal: cereal OR fruit smoothie OR bagel Snack: chips Second Meal: none OR [place] food OR chickfila Snack: none OR chips OR fruit Third Meal: chicken tacos Snack: none OR candy Beverages: coffee, water   NUTRITION DIAGNOSIS  Janice Reyes Altered nutrition-related laboratory As related to HLD.  As evidenced by LDL 156, pt report of low intake of vegetables.   NUTRITION INTERVENTION  Nutrition education (E-1) on the following topics:   Fiber Dietary fiber is essential for health and comes in two types: soluble and insoluble fiber. Soluble Fiber: Characteristics:  Dissolves in water, forming a gel-like substance. Sources: Oats, nuts, seeds, beans, lentils, fruits (apples, citrus), and vegetables (carrots). Benefits: Regulates blood sugar, lowers LDL cholesterol, supports heart health, and aids in digestion by forming a gel that prevents diarrhea. Insoluble Fiber: Characteristics: Does not dissolve in water and adds bulk to stool. Sources: Whole grains, bran, nuts, seeds, vegetables (cauliflower, green beans), and fruits (apples with skin, berries). Benefits: Promotes regular bowel movements, aids in weight management, and prevents diverticular disease.  Plate Method/Food Groups Fruits & Vegetables: Aim to fill half your plate with a variety of fruits and vegetables. They are rich in vitamins, minerals, and fiber, and can help reduce the risk of chronic diseases. Choose a colorful assortment of fruits and vegetables to ensure you get a wide range of nutrients. Grains and Starches: Make at least half of your grain choices whole grains, such as brown rice, whole wheat bread, and oats. Whole grains provide fiber, which aids in digestion and healthy cholesterol levels. Aim for whole forms of starchy vegetables such as potatoes, sweet potatoes, beans, peas, and corn, which are fiber rich and provide many vitamins and minerals.  Protein: Incorporate lean sources of protein, such as poultry, fish, beans, nuts, and seeds, into your meals. Protein is essential for building and repairing tissues, staying full, balancing blood sugar, as well as supporting immune function. Dairy: Include low-fat or fat-free dairy products like milk, yogurt, and cheese in your diet. Dairy foods are excellent sources of calcium  and vitamin D , which are crucial for bone health.   LDL-Lowering Nutrition Therapy Soluble fiber and impact on lowering cholesterol: Sources:  Oats, barley, beans, lentils, fruits (apples, oranges), vegetables (carrots, broccoli), and flaxseeds. Benefits: Soluble fiber  reduces the absorption of cholesterol into your bloodstream. Choose Healthy Unsaturated Fats and Omega 3's. Sources: Olive oil, canola oil, avocados, nuts, and Fatty fish (salmon, trout), walnuts, flaxseeds, and chia seeds. Benefits: Helps raise HDL cholesterol and lower LDL cholesterol. Omega-3s help reduce triglycerides and raise HDL cholesterol. Limit Saturated Fats: Sources: Red meat, full-fat dairy products, butter, and coconut oil. Benefits: Reducing intake helps lower LDL cholesterol levels. Eat Plenty of Fruits and Vegetables. Benefits: High in fiber and antioxidants, which help lower LDL cholesterol. Eat Whole Grains. Sources: Brown rice, whole wheat bread, quinoa, and whole grain pasta. Benefits: Whole grains contain more fiber and nutrients than refined grains. Regular Physical Activity: Aim for at least 30 minutes of moderate-intensity exercise most days of the week.   Handouts Provided Include  Balanced Snacks Plate Method  Learning Style & Readiness for Change Teaching method utilized: Visual & Auditory  Demonstrated degree of understanding via: Teach Back  Barriers to learning/adherence to lifestyle change: none  Goals Established by Pt  Goal 1: incorporate non-starchy vegetables at least twice per day.  Goal 2: create a more well-balanced breakfast.   MONITORING & EVALUATION Dietary intake, weekly physical activity, and follow up in 6 weeks.  Next Steps  Patient is to call for questions.

## 2023-11-17 NOTE — Patient Instructions (Signed)
 Goals Established by Pt  Goal 1: incorporate non-starchy vegetables at least twice per day.  Goal 2: create a more well-balanced breakfast.

## 2023-12-04 ENCOUNTER — Encounter: Payer: Self-pay | Admitting: Family Medicine

## 2023-12-06 ENCOUNTER — Ambulatory Visit: Payer: Self-pay | Admitting: Family Medicine

## 2023-12-06 MED ORDER — VITAMIN D (ERGOCALCIFEROL) 1.25 MG (50000 UNIT) PO CAPS: 50000.0000 [IU] | ORAL_CAPSULE | ORAL | 0 refills | Status: AC

## 2023-12-06 NOTE — Telephone Encounter (Signed)
 Called pt to give her lab results and let her know Dr. Tanda has sent in a prescription of vitamin D  medication she would like her to take. No answer, LVM

## 2023-12-16 ENCOUNTER — Encounter: Admitting: Family Medicine

## 2023-12-29 ENCOUNTER — Ambulatory Visit: Admitting: Dietician

## 2024-02-25 ENCOUNTER — Other Ambulatory Visit: Payer: Self-pay | Admitting: Family Medicine

## 2024-11-14 ENCOUNTER — Encounter: Admitting: Family Medicine
